# Patient Record
Sex: Male | Born: 1985 | Race: Black or African American | Hispanic: No | Marital: Single | Smoking: Never smoker
Health system: Southern US, Community
[De-identification: ages and names within clinical notes are randomized; demographics above are authoritative.]

## PROBLEM LIST (undated history)

## (undated) DIAGNOSIS — G809 Cerebral palsy, unspecified: Secondary | ICD-10-CM

## (undated) HISTORY — DX: Cerebral palsy, unspecified: G80.9

## (undated) HISTORY — PX: TENDON RELEASE: SHX230

---

## 2005-10-17 ENCOUNTER — Emergency Department: Payer: Self-pay | Admitting: Emergency Medicine

## 2007-04-21 ENCOUNTER — Emergency Department: Payer: Self-pay | Admitting: Emergency Medicine

## 2012-02-25 ENCOUNTER — Emergency Department: Payer: Self-pay | Admitting: Internal Medicine

## 2012-05-06 ENCOUNTER — Emergency Department: Payer: Self-pay | Admitting: Emergency Medicine

## 2012-05-06 LAB — COMPREHENSIVE METABOLIC PANEL
Albumin: 4.5 g/dL (ref 3.4–5.0)
Alkaline Phosphatase: 67 U/L (ref 50–136)
Anion Gap: 7 (ref 7–16)
BUN: 15 mg/dL (ref 7–18)
Bilirubin,Total: 0.9 mg/dL (ref 0.2–1.0)
Calcium, Total: 9.7 mg/dL (ref 8.5–10.1)
Chloride: 106 mmol/L (ref 98–107)
EGFR (African American): >60
EGFR (Non-African Amer.): >60
Glucose: 92 mg/dL (ref 65–99)
Osmolality: 276 (ref 275–301)
SGPT (ALT): 33 U/L (ref 12–78)
Sodium: 138 mmol/L (ref 136–145)

## 2012-05-06 LAB — URINALYSIS, COMPLETE
Bacteria: NONE SEEN
Bilirubin,UR: NEGATIVE
Blood: NEGATIVE
Glucose,UR: NEGATIVE mg/dL (ref 0–75)
Leukocyte Esterase: NEGATIVE
Nitrite: NEGATIVE
Protein: 30
Specific Gravity: 1.03 (ref 1.003–1.030)
WBC UR: 1 /HPF (ref 0–5)

## 2012-05-06 LAB — CBC
HCT: 47.8 % (ref 40.0–52.0)
HGB: 15.1 g/dL (ref 13.0–18.0)
MCHC: 31.5 g/dL — ABNORMAL LOW (ref 32.0–36.0)
MCV: 71 fL — ABNORMAL LOW (ref 80–100)
RBC: 6.7 10*6/uL — ABNORMAL HIGH (ref 4.40–5.90)
RDW: 14.9 % — ABNORMAL HIGH (ref 11.5–14.5)
WBC: 8.6 10*3/uL (ref 3.8–10.6)

## 2012-05-10 ENCOUNTER — Emergency Department: Payer: Self-pay | Admitting: Emergency Medicine

## 2012-05-10 LAB — COMPREHENSIVE METABOLIC PANEL
Anion Gap: 5 — ABNORMAL LOW (ref 7–16)
Chloride: 109 mmol/L — ABNORMAL HIGH (ref 98–107)
Creatinine: 0.88 mg/dL (ref 0.60–1.30)
EGFR (African American): >60
EGFR (Non-African Amer.): >60
Glucose: 80 mg/dL (ref 65–99)
Potassium: 3.7 mmol/L (ref 3.5–5.1)
SGOT(AST): 16 U/L (ref 15–37)
SGPT (ALT): 23 U/L (ref 12–78)
Sodium: 140 mmol/L (ref 136–145)
Total Protein: 7.2 g/dL (ref 6.4–8.2)

## 2012-05-10 LAB — LIPASE, BLOOD: Lipase: 69 U/L — ABNORMAL LOW (ref 73–393)

## 2012-05-10 LAB — CBC
HCT: 42.5 % (ref 40.0–52.0)
MCV: 71 fL — ABNORMAL LOW (ref 80–100)
Platelet: 132 10*3/uL — ABNORMAL LOW (ref 150–440)
RDW: 14.4 % (ref 11.5–14.5)
WBC: 6.1 10*3/uL (ref 3.8–10.6)

## 2014-07-19 IMAGING — CT CT ABD-PELV W/O CM
1 of 2 series · 16 of 32 positions shown, 20 images · non-contrast
Comparison: none

REASON FOR EXAM: (1) right flank pain; (2) right flank pain
COMMENTS:   May transport without cardiac monitor

PROCEDURE:     CT  - CT ABDOMEN AND PELVIS W[DATE] [DATE]
RESULT:     Abdominal CT dated
TECHNIQUE: Helical noncontrasted 3 mm sections were obtained the lung bases
through the pubic symphysis.

[Series 2: 3mm soft tissue · axial · 0.64mm/px · z∈[-554,-158]mm · 16 of 146 slices shown, 20 images]
[im 7/146  soft-tissue]
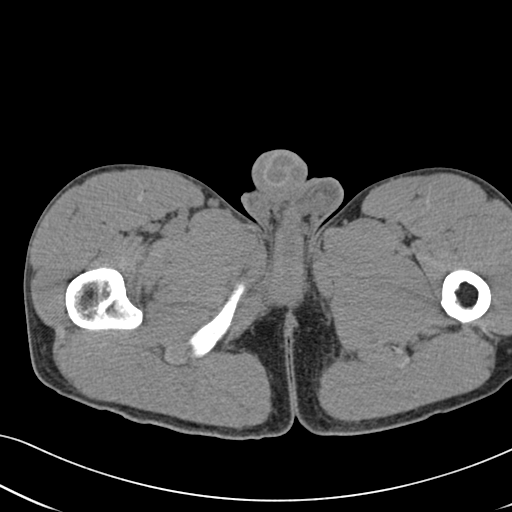
[im 7/146  bone]
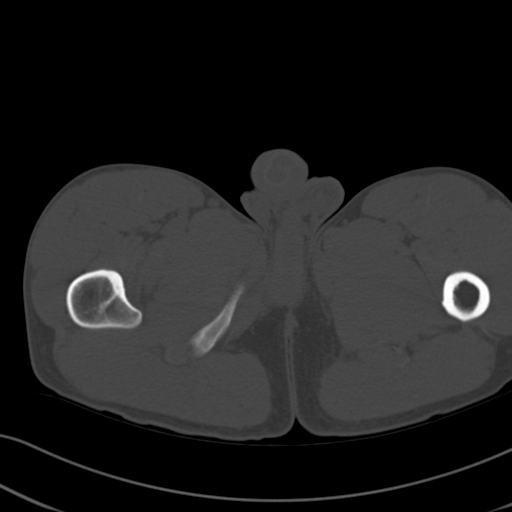
[im 19/146  soft-tissue]
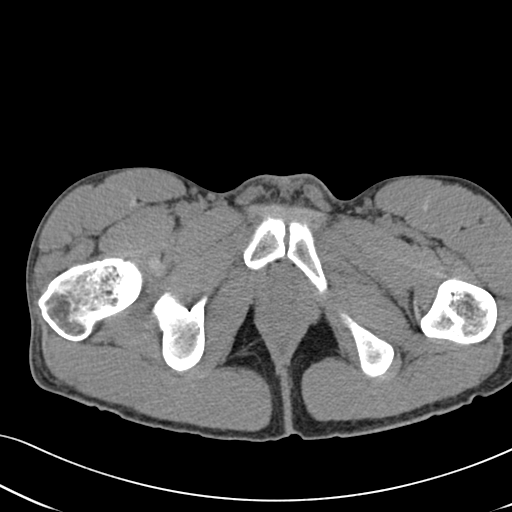
[im 31/146  soft-tissue]
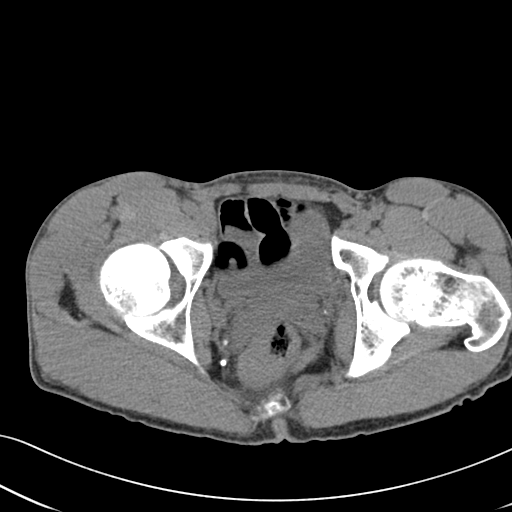
[im 37/146  soft-tissue]
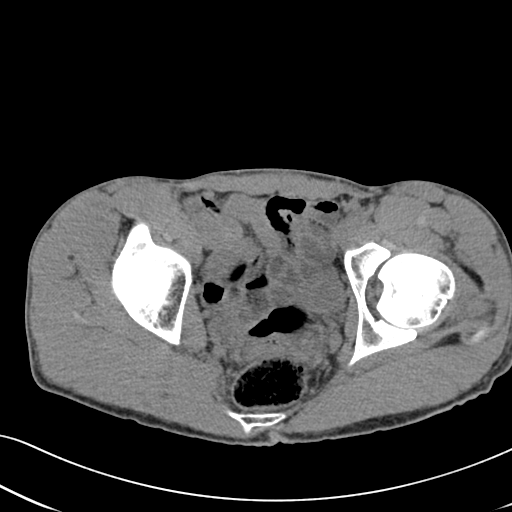
[im 49/146  soft-tissue]
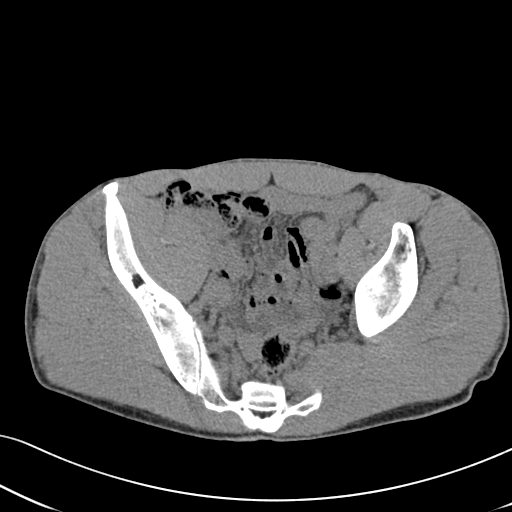
[im 61/146  soft-tissue]
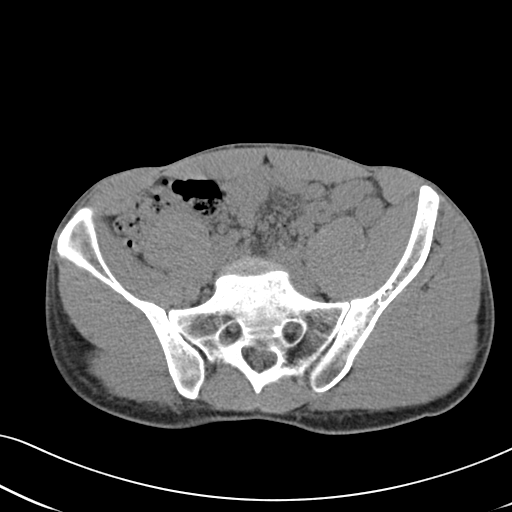
[im 67/146  soft-tissue]
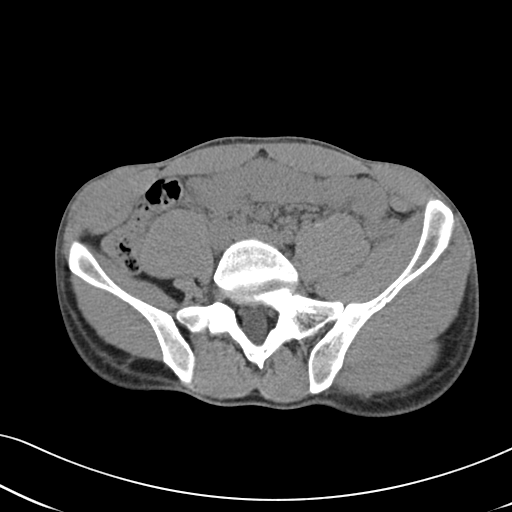
[im 79/146  soft-tissue]
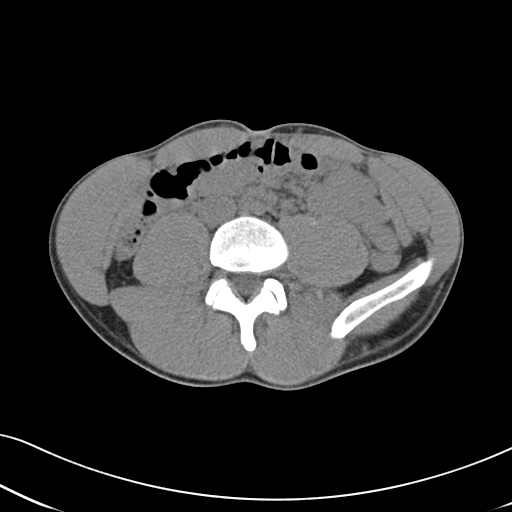
[im 85/146  soft-tissue]
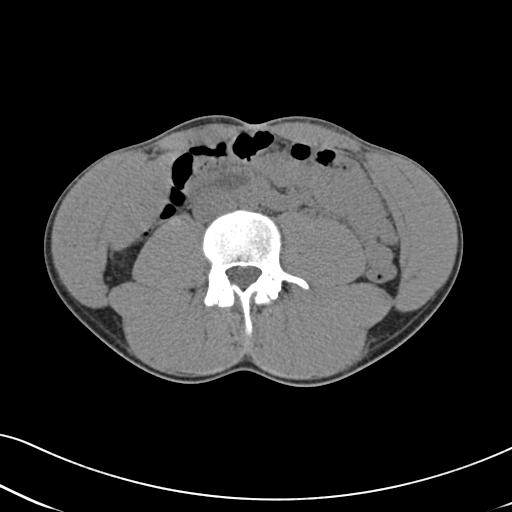
[im 85/146  bone]
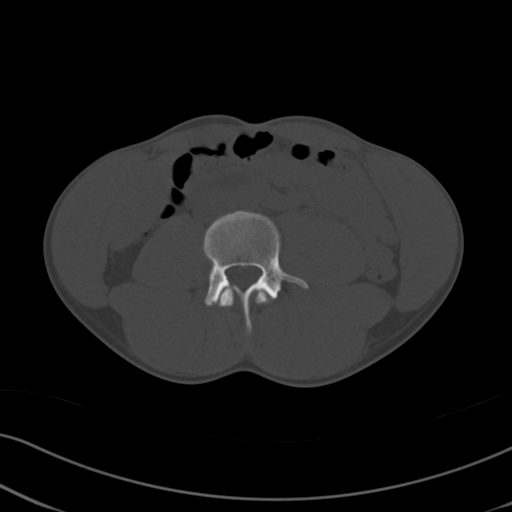
[im 97/146  soft-tissue]
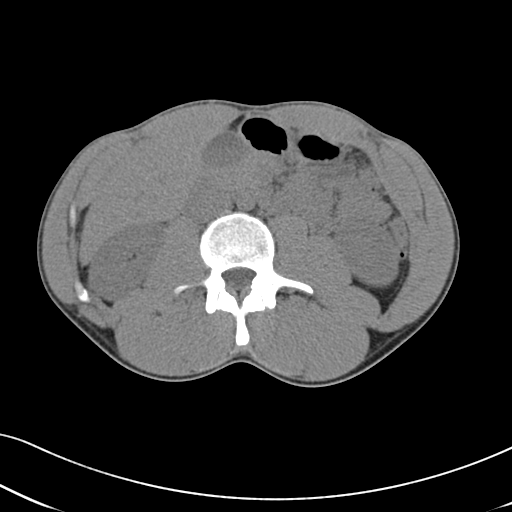
[im 109/146  soft-tissue]
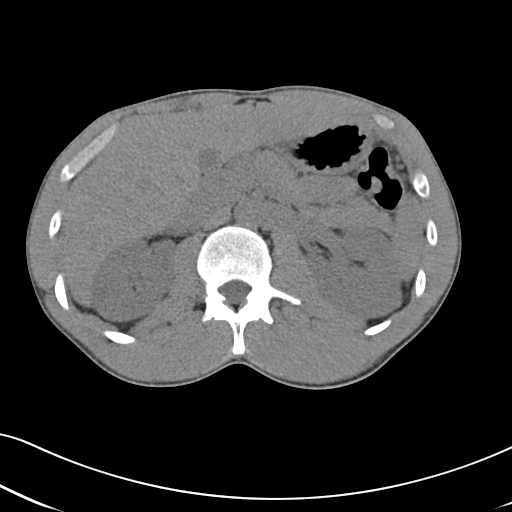
[im 115/146  soft-tissue]
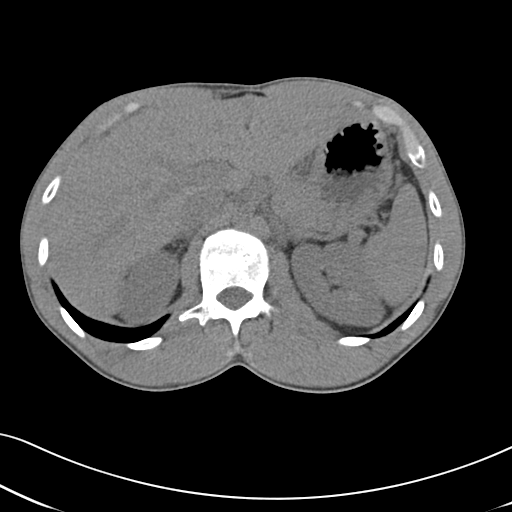
[im 121/146  lung]
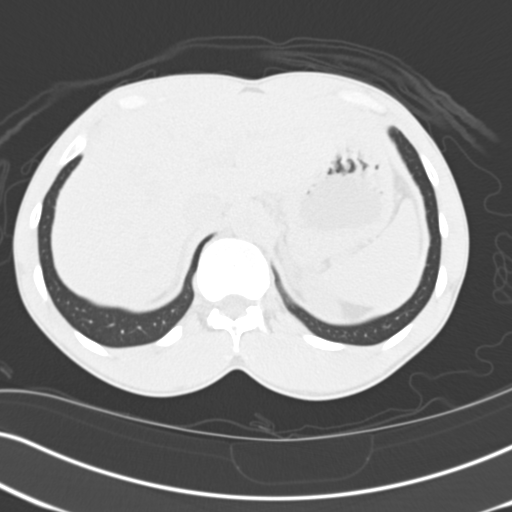
[im 127/146  soft-tissue]
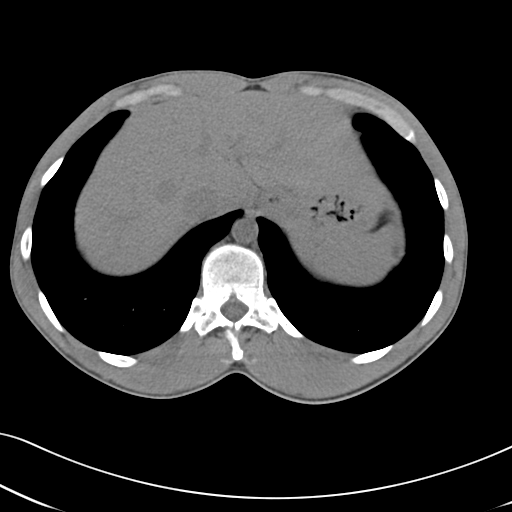
[im 127/146  lung]
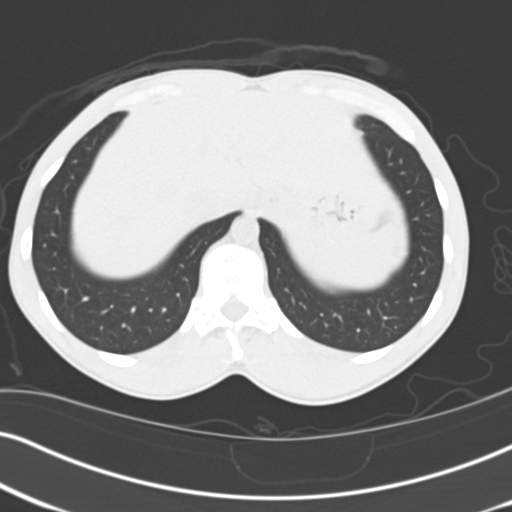
[im 133/146  lung]
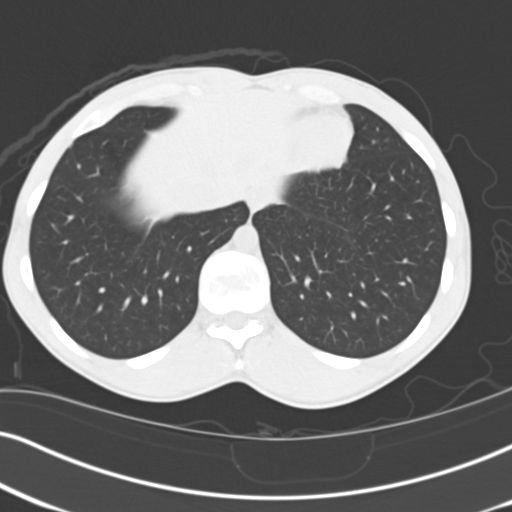
[im 139/146  soft-tissue]
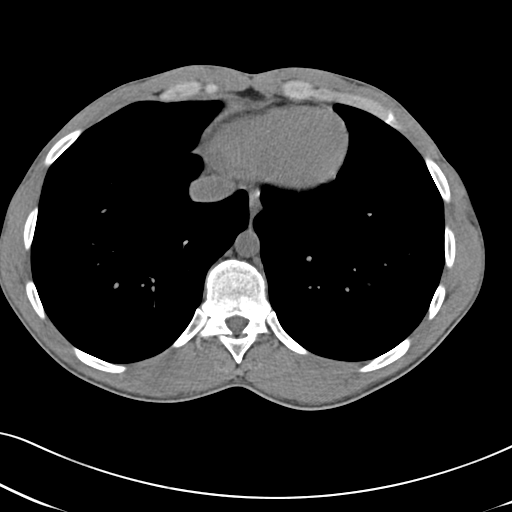
[im 139/146  lung]
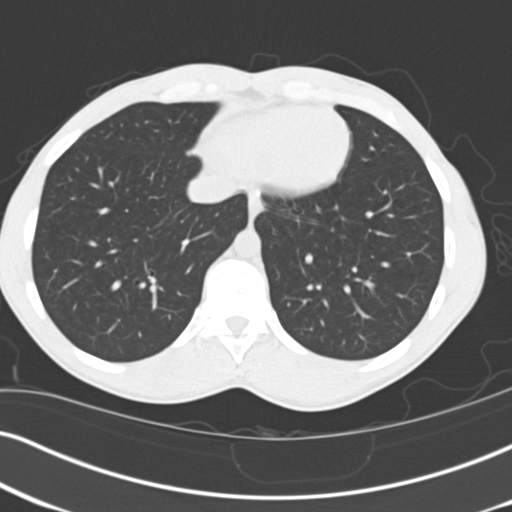

[16 of 32 positions shown; findings below may reference images not displayed]

FINDINGS: The lung bases are unremarkable.

Noncontrast evaluation of the liver, spleen, adrenals, pancreas, kidneys is
unremarkable. There is no CT evidence of bowel obstruction, enteritis,
colitis, diverticulitis, nor appendicitis within the limitations of a
noncontrasted CT. There is no evidence of an abdominal aortic aneurysm.
There is no gross evidence of pathologic sized adenopathy, masses, loculated
fluid collections nor evidence of free fluid within the limitations of a
noncontrasted CT.
IMPRESSION: No CT evidence of obstructive or inflammatory abnormalities.

## 2021-06-20 ENCOUNTER — Ambulatory Visit
Admission: EM | Admit: 2021-06-20 | Discharge: 2021-06-20 | Payer: Self-pay | Attending: Family Medicine | Admitting: Family Medicine

## 2021-06-20 ENCOUNTER — Emergency Department
Admission: EM | Admit: 2021-06-20 | Discharge: 2021-06-20 | Disposition: A | Discharge status: Home / Self Care | Payer: Self-pay | Attending: Emergency Medicine | Admitting: Emergency Medicine

## 2021-06-20 ENCOUNTER — Emergency Department: Payer: Self-pay

## 2021-06-20 ENCOUNTER — Other Ambulatory Visit: Payer: Self-pay

## 2021-06-20 DIAGNOSIS — R61 Generalized hyperhidrosis: Secondary | ICD-10-CM | POA: Insufficient documentation

## 2021-06-20 DIAGNOSIS — R1112 Projectile vomiting: Secondary | ICD-10-CM | POA: Insufficient documentation

## 2021-06-20 DIAGNOSIS — K30 Functional dyspepsia: Secondary | ICD-10-CM | POA: Insufficient documentation

## 2021-06-20 DIAGNOSIS — R112 Nausea with vomiting, unspecified: Secondary | ICD-10-CM

## 2021-06-20 DIAGNOSIS — R519 Headache, unspecified: Secondary | ICD-10-CM | POA: Insufficient documentation

## 2021-06-20 DIAGNOSIS — D72829 Elevated white blood cell count, unspecified: Secondary | ICD-10-CM | POA: Insufficient documentation

## 2021-06-20 DIAGNOSIS — R202 Paresthesia of skin: Secondary | ICD-10-CM | POA: Insufficient documentation

## 2021-06-20 DIAGNOSIS — R079 Chest pain, unspecified: Secondary | ICD-10-CM

## 2021-06-20 LAB — BASIC METABOLIC PANEL
Anion gap: 11 (ref 5–15)
BUN: 14 mg/dL (ref 6–20)
CO2: 21 mmol/L — ABNORMAL LOW (ref 22–32)
Calcium: 9.9 mg/dL (ref 8.9–10.3)
Chloride: 104 mmol/L (ref 98–111)
Creatinine, Ser: 0.91 mg/dL (ref 0.61–1.24)
GFR, Estimated: >60 mL/min (ref >60)
Glucose, Bld: 119 mg/dL — ABNORMAL HIGH (ref 70–99)
Potassium: 3.5 mmol/L (ref 3.5–5.1)
Sodium: 136 mmol/L (ref 135–145)

## 2021-06-20 LAB — HEPATIC FUNCTION PANEL
ALT: 21 U/L (ref 0–44)
AST: 28 U/L (ref 15–41)
Albumin: 4.4 g/dL (ref 3.5–5.0)
Alkaline Phosphatase: 63 U/L (ref 38–126)
Bilirubin, Direct: 0.2 mg/dL (ref 0.0–0.2)
Indirect Bilirubin: 1.2 mg/dL — ABNORMAL HIGH (ref 0.3–0.9)
Total Bilirubin: 1.4 mg/dL — ABNORMAL HIGH (ref 0.3–1.2)
Total Protein: 7.7 g/dL (ref 6.5–8.1)

## 2021-06-20 LAB — CBC WITH DIFFERENTIAL/PLATELET
Abs Immature Granulocytes: 0.06 10*3/uL (ref 0.00–0.07)
Basophils Absolute: 0 10*3/uL (ref 0.0–0.1)
Basophils Relative: 0 %
Eosinophils Absolute: 0 10*3/uL (ref 0.0–0.5)
Eosinophils Relative: 0 %
HCT: 45.9 % (ref 39.0–52.0)
Hemoglobin: 14.2 g/dL (ref 13.0–17.0)
Immature Granulocytes: 1 %
Lymphocytes Relative: 8 %
Lymphs Abs: 0.9 10*3/uL (ref 0.7–4.0)
MCH: 21.6 pg — ABNORMAL LOW (ref 26.0–34.0)
MCHC: 30.9 g/dL (ref 30.0–36.0)
MCV: 70 fL — ABNORMAL LOW (ref 80.0–100.0)
Monocytes Absolute: 0.5 10*3/uL (ref 0.1–1.0)
Monocytes Relative: 4 %
Neutro Abs: 10.2 10*3/uL — ABNORMAL HIGH (ref 1.7–7.7)
Neutrophils Relative %: 87 %
Platelets: 177 10*3/uL (ref 150–400)
RBC: 6.56 MIL/uL — ABNORMAL HIGH (ref 4.22–5.81)
RDW: 15.4 % (ref 11.5–15.5)
WBC: 11.7 10*3/uL — ABNORMAL HIGH (ref 4.0–10.5)
nRBC: 0 % (ref 0.0–0.2)

## 2021-06-20 LAB — POCT FASTING CBG KUC MANUAL ENTRY: POCT Glucose (KUC): 140 mg/dL — AB (ref 70–99)

## 2021-06-20 LAB — TROPONIN I (HIGH SENSITIVITY)
Troponin I (High Sensitivity): 4 ng/L (ref <18)
Troponin I (High Sensitivity): 3 ng/L (ref <18)

## 2021-06-20 LAB — LIPASE, BLOOD: Lipase: 23 U/L (ref 11–51)

## 2021-06-20 MED ORDER — ONDANSETRON 4 MG PO TBDP: 4.0000 mg | ORAL_TABLET | Freq: Three times a day (TID) | ORAL | 0 refills | Status: AC | PRN

## 2021-06-20 MED ORDER — ONDANSETRON 4 MG PO TBDP
4.0000 mg | ORAL_TABLET | Freq: Once | ORAL | Status: AC
  Administered 2021-06-20: 4 mg via ORAL

## 2021-06-20 NOTE — ED Triage Notes (Signed)
Patient presents to Urgent Care with complaints of emesis, chest pain, SOB, diaphoresis, and generalized numbness since today. He states no hx of DM or cardiac problems. He reports marijuana use from his own stash. Brought in by his gf.  ?

## 2021-06-20 NOTE — ED Notes (Signed)
EMS called

## 2021-06-20 NOTE — ED Provider Notes (Signed)
? ?[facility] ?Provider Note ? ? ? Event Date/Time  ? First MD Initiated Contact with Patient 06/20/21 2210   ?  (approximate) ? ? ?History  ? ?Chest Pain and Emesis ? ? ?HPI ? ?Glenn Mccormick is a 36 y.o. male with a past medical history of cerebral palsy who presents to the emergency room after an referred from urgent care for further evaluation of some chest pain associated with headache as well as nausea and vomiting and some tingling in his hands that he states started today.  States it started after he smoked some weed eating up from a cousin is not sure if there is something in that.  He had an Egg McMuffin for breakfast is not sure if he could have gotten food poisoning.  States Zofran he received earlier has helped a lot and he is feeling a lot better.  He longer has any significant chest pain.  He denies any associated fevers, cough, shortness of breath abdominal pain, back pain, earache, sore throat, vision changes or any focal weakness.  No other illicit drugs or any EtOH use.  He denies tobacco abuse.  No recent falls or injuries.  No other acute concerns at this time. ? ?Patient did receive patient 324 mg of ASA prior to arrival. ? ?  ?History reviewed. No pertinent past medical history. ? ? ? ?Physical Exam  ?Triage Vital Signs: ?ED Triage Vitals  ?Enc Vitals Group  ?   BP 06/20/21 1919 96/81  ?   Pulse Rate 06/20/21 1919 81  ?   Resp 06/20/21 1919 (!) 22  ?   Temp 06/20/21 1923 97.9 ?F (36.6 ?C)  ?   Temp Source 06/20/21 1923 Oral  ?   SpO2 06/20/21 1919 98 %  ?   Weight 06/20/21 1920 145 lb (65.8 kg)  ?   Height 06/20/21 1920 5\' 8"  (1.727 m)  ?   Head Circumference --   ?   Peak Flow --   ?   Pain Score 06/20/21 1919 6  ?   Pain Loc --   ?   Pain Edu? --   ?   Excl. in GC? --   ? ? ?Most recent vital signs: ?Vitals:  ? 06/20/21 2152 06/20/21 2252  ?BP: 121/67 118/72  ?Pulse: 69 78  ?Resp: 20 18  ?Temp:  98 ?F (36.7 ?C)  ?SpO2: 99% 99%  ? ? ?General: Awake, no distress.   ?CV:  Good peripheral perfusion.  2+ radial pulses.  No murmur. ?Resp:  Normal effort.  Clear bilaterally. ?Abd:  No distention.  Soft throughout ?Other:   ? ? ?ED Results / Procedures / Treatments  ?Labs ?(all labs ordered are listed, but only abnormal results are displayed) ?Labs Reviewed  ?BASIC METABOLIC PANEL - Abnormal; Notable for the following components:  ?    Result Value  ? CO2 21 (*)   ? Glucose, Bld 119 (*)   ? All other components within normal limits  ?CBC WITH DIFFERENTIAL/PLATELET - Abnormal; Notable for the following components:  ? WBC 11.7 (*)   ? RBC 6.56 (*)   ? MCV 70.0 (*)   ? MCH 21.6 (*)   ? Neutro Abs 10.2 (*)   ? All other components within normal limits  ?HEPATIC FUNCTION PANEL - Abnormal; Notable for the following components:  ? Total Bilirubin 1.4 (*)   ? Indirect Bilirubin 1.2 (*)   ? All other components within normal limits  ?LIPASE, BLOOD  ?  TROPONIN I (HIGH SENSITIVITY)  ?TROPONIN I (HIGH SENSITIVITY)  ? ? ? ?EKG ? ?EKG is remarkable for sinus rhythm with a ventricular rate of 77, normal axis, nonspecific ST change in anterior and lateral leads without other clear evidence of acute ischemia or significant arrhythmia. ? ? ?RADIOLOGY ?Chest reviewed by myself shows no focal consoidation, effusion, edema, pneumothorax or other clear acute thoracic process. I also reviewed radiology interpretation and agree with findings described. ? ? ? ?PROCEDURES: ? ?Critical Care performed: No ? ?.1-3 Lead EKG Interpretation ?Performed by: Gilles Chiquito, MD ?Authorized by: Gilles Chiquito, MD  ? ?  Interpretation: normal   ?  ECG rate assessment: normal   ?  Rhythm: sinus rhythm   ?  Ectopy: none   ?  Conduction: normal   ? ?The patient is on the cardiac monitor to evaluate for evidence of arrhythmia and/or significant heart rate changes. ? ? ?MEDICATIONS ORDERED IN ED: ?Medications - No data to display ? ? ?IMPRESSION / MDM / ASSESSMENT AND PLAN / ED COURSE  ?I reviewed the triage vital signs  and the nursing notes. ?             ?               ? ?Differential diagnosis includes, but is not limited to ACS, pneumonia, pneumothorax, metabolic derangements, gastritis, and possible arrhythmia.  While patient does not have any ongoing chest pain or abnormal vitals or any shortness of breath it is just a PE.  No clear risk factors for this.  Overall description of symptoms is not suggestive of an acute dissection. ? ?ECG with nonspecific findings however given nonelevated troponin x2 Evalose patient for ACS or myocarditis.  Nonelevated troponin x2 are not suggestive of ACS or myocarditis. ? ?Chest reviewed by myself shows no focal consoidation, effusion, edema, pneumothorax or other clear acute thoracic process. I also reviewed radiology interpretation and agree with findings described. ? ?BMP shows no significant electrolyte or metabolic derangements.  CBC shows WBC count of 11.7 and no evidence of acute anemia.  Hepatic function panel grossly unremarkable and lipase not suggestive of pancreatitis.  Unclear if episode earlier today was related to some gastritis or effects of cannabis use.  Additional considerations include infectious gastritis.  Given stable vitals with patient stating he is feeling much better and low suspicion for other immediate life-threatening process I think he is stable for discharge with outpatient follow-up.  Counseled on avoiding any similar THC products.  Discharged in stable condition.  Strict and precautions advised discussed.  Instructed follow-up with primary care. ? ?  ? ? ?FINAL CLINICAL IMPRESSION(S) / ED DIAGNOSES  ? ?Final diagnoses:  ?Chest pain, unspecified type  ?Nausea and vomiting, unspecified vomiting type  ? ? ? ?Rx / DC Orders  ? ?ED Discharge Orders   ? ?      Ordered  ?  ondansetron (ZOFRAN-ODT) 4 MG disintegrating tablet  Every 8 hours PRN       ? 06/20/21 2250  ? ?  ?  ? ?  ? ? ? ?Note:  This document was prepared using Dragon voice recognition software and may  include unintentional dictation errors. ?  ?Gilles Chiquito, MD ?06/20/21 2333 ? ?

## 2021-06-20 NOTE — ED Notes (Signed)
Provider at bedside

## 2021-06-20 NOTE — ED Triage Notes (Signed)
Per EMS, pt was brought from urgent care with c/o CP 10+ episodes of emesis, nausea, "numbness" and pain in chest and arms bilaterally, diaphoresis, and pallor that started this AM. Pt was given SL zofran at clinic and 4 81 mg ASA en route. Pt admits to smoking marijuana this AM- states this is not unusual and nothing has changed with supply. Pt is AOX4, appears uncomfortable. Skin is warm and dry, CMS intact. Reports exposure to virus.  ?BGL-140 ?137/72 ?72 ?22 ?History of cerebral palsy, no cardiac history per pt ?

## 2021-06-20 NOTE — ED Provider Notes (Signed)
?[facility] ? ? ? ?CSN: [id number] ?Arrival date & time: 06/20/21  1800 ? ? ?  ? ?History   ?Chief Complaint ?No chief complaint on file. ? ? ?HPI ?Glenn Mccormick is a 36 y.o. male.  ? ?HPI ?Patient presented and was sitting in the lobby diaphoretic, complaining of nausea and bilateral numb's in both arms and further explained that he was having chest pain and headache. Patient immediately began to projectile vomit, became lethargic, although remained conscious, and oriented. Patient was placed in a wheelchair , brought to an exam room, blood sugar collect-140, ECG NSR with right Axis deviation-no acute ST elevated. Patient given Zofran for nausea. 911 called arrived less than 15 minutes.  ? ?No past medical history on file. ? ?There are no problems to display for this patient. ? ? ? ? ?Home Medications   ? ?Prior to Admission medications   ?Not on File  ? ? ?Family History ?No family history on file. ? ?Social History ?  ? ? ?Allergies   ?Patient has no allergy information on record. ? ? ?Review of Systems ?Review of Systems ?Pertinent negatives listed in HPI  ? ?Physical Exam ?Triage Vital Signs ?ED Triage Vitals  ?Enc Vitals Group  ?   BP   ?   Pulse   ?   Resp   ?   Temp   ?   Temp src   ?   SpO2   ?   Weight   ?   Height   ?   Head Circumference   ?   Peak Flow   ?   Pain Score   ?   Pain Loc   ?   Pain Edu?   ?   Excl. in [place]?   ? ?No data found. ? ?Updated Vital Signs ?BP (!) 145/71 (BP Location: Left Arm)   Pulse 78   Temp 98.1 ?F (36.7 ?C) (Oral)   Resp (!) 22   SpO2 98%  ? ?Visual Acuity ?Right Eye Distance:   ?Left Eye Distance:   ?Bilateral Distance:   ? ?Right Eye Near:   ?Left Eye Near:    ?Bilateral Near:    ? ?Physical Exam ?Constitutional:   ?   General: He is in acute distress.  ?   Appearance: He is ill-appearing and diaphoretic.  ?Cardiovascular:  ?   Rate and Rhythm: Normal rate and regular rhythm.  ?Pulmonary:  ?   Effort: Tachypnea, accessory muscle usage and respiratory distress  present.  ?Skin: ?   General: Skin is warm and moist.  ?Neurological:  ?   GCS: GCS eye subscore is 4. GCS verbal subscore is 5. GCS motor subscore is 6.  ?Psychiatric:     ?   Attention and Perception: Attention normal.     ?   Mood and Affect: Mood normal.  ? ? ?UC Treatments / Results  ?Labs ?(all labs ordered are listed, but only abnormal results are displayed) ?Labs Reviewed - No data to display ? ?EKG ? ? ?Radiology ?No results found. ? ?Procedures ?Procedures (including critical care time) ? ?Medications Ordered in UC ?Medications - No data to display ? ?Initial Impression / Assessment and Plan / UC Course  ?I have reviewed the triage vital signs and the nursing notes. ? ?Pertinent labs & imaging results that were available during my care of the patient were reviewed by me and considered in my medical decision making (see chart for details). ? ?  ?EMS arrived and  assumed care.  Patient provided [place] with numbers to contact his family.  We have called all numbers and been unsuccessful in reaching anyone by phone.  He was dropped off here by his girlfriend.  We will notify her if she returns prior to closing that he was transported to [facility].  Patient is alert and stable when discharged to EMS.Marland Kitchen ?Final Clinical Impressions(s) / UC Diagnoses  ? ?Final diagnoses:  ?Gastrointestinal distress  ?Diaphoresis  ?Chest pain, unspecified type  ?Projectile vomiting with nausea  ?Paresthesia  ? ?Discharge Instructions   ?None ?  ? ?ED Prescriptions   ?None ?  ? ?PDMP not reviewed this encounter. ?  ?Scot Jun, FNP ?06/20/21 1858 ? ?

## 2021-06-20 NOTE — ED Triage Notes (Addendum)
Enter in error

## 2022-01-26 ENCOUNTER — Ambulatory Visit: Admission: EM | Admit: 2022-01-26 | Discharge: 2022-01-26 | Discharge status: Against Medical Advice | Payer: Self-pay

## 2022-01-26 ENCOUNTER — Emergency Department
Admission: EM | Admit: 2022-01-26 | Discharge: 2022-01-26 | Disposition: A | Discharge status: Home / Self Care | Payer: Self-pay | Attending: Emergency Medicine | Admitting: Emergency Medicine

## 2022-01-26 ENCOUNTER — Encounter: Payer: Self-pay | Admitting: Emergency Medicine

## 2022-01-26 ENCOUNTER — Other Ambulatory Visit: Payer: Self-pay

## 2022-01-26 DIAGNOSIS — M436 Torticollis: Secondary | ICD-10-CM | POA: Insufficient documentation

## 2022-01-26 MED ORDER — KETOROLAC TROMETHAMINE 15 MG/ML IJ SOLN
15.0000 mg | Freq: Once | INTRAMUSCULAR | Status: AC
  Administered 2022-01-26: 15 mg via INTRAMUSCULAR
  Filled 2022-01-26: qty 1

## 2022-01-26 MED ORDER — LIDOCAINE 5 % EX PTCH: 1.0000 | MEDICATED_PATCH | Freq: Two times a day (BID) | CUTANEOUS | 0 refills | Status: AC

## 2022-01-26 MED ORDER — LIDOCAINE 5 % EX PTCH
1.0000 | MEDICATED_PATCH | CUTANEOUS | Status: DC
  Administered 2022-01-26: 1 via TRANSDERMAL
  Filled 2022-01-26: qty 1

## 2022-01-26 MED ORDER — CYCLOBENZAPRINE HCL 10 MG PO TABS
10.0000 mg | ORAL_TABLET | Freq: Once | ORAL | Status: AC
  Administered 2022-01-26: 10 mg via ORAL
  Filled 2022-01-26: qty 1

## 2022-01-26 MED ORDER — CYCLOBENZAPRINE HCL 5 MG PO TABS: 5.0000 mg | ORAL_TABLET | Freq: Three times a day (TID) | ORAL | 0 refills | Status: AC | PRN

## 2022-01-26 MED ORDER — NAPROXEN 500 MG PO TABS
500.0000 mg | ORAL_TABLET | Freq: Two times a day (BID) | ORAL | 0 refills | Status: AC
Start: 2022-01-26 — End: 2022-02-02

## 2022-01-26 NOTE — ED Triage Notes (Signed)
Pt presents to ED via POV due to neck spasms that started this morning.Pt A&Ox4. Airway patent.

## 2022-01-26 NOTE — ED Provider Notes (Signed)
[facility] Provider Note    Event Date/Time   First MD Initiated Contact with Patient 01/26/22 [phone]     (approximate)   History   Neck Pain   HPI  Glenn Mccormick is a 36 y.o. male who presents today for evaluation of bilateral posterior neck pain and decreased range of motion.  He reports that he slept in a different position last night and woke up feeling like he had a stiff neck.  He has pain to his bilateral sides, though worse on the left.  He denies headache, dizziness, visual changes, or paresthesias/weakness in his upper extremities.  There are no problems to display for this patient.         Physical Exam   Triage Vital Signs: ED Triage Vitals  Enc Vitals Group     BP 01/26/22 0926 139/79     Pulse Rate 01/26/22 0926 67     Resp 01/26/22 0926 16     Temp 01/26/22 0926 98.4 F (36.9 C)     Temp Source 01/26/22 0926 Oral     SpO2 01/26/22 0926 99 %     Weight 01/26/22 0928 147 lb (66.7 kg)     Height 01/26/22 0928 5\' 8"  (1.727 m)     Head Circumference --      Peak Flow --      Pain Score 01/26/22 0927 7     Pain Loc --      Pain Edu? --      Excl. in GC? --     Most recent vital signs: Vitals:   01/26/22 0926  BP: 139/79  Pulse: 67  Resp: 16  Temp: 98.4 F (36.9 C)  SpO2: 99%    Physical Exam Vitals and nursing note reviewed.  Constitutional:      General: Awake and alert. No acute distress.    Appearance: Normal appearance. The patient is normal weight.  HENT:     Head: Normocephalic and atraumatic.     Mouth: Mucous membranes are moist.  Eyes:     General: PERRL. Normal EOMs        Right eye: No discharge.        Left eye: No discharge.     Conjunctiva/sclera: Conjunctivae normal.  Cardiovascular:     Rate and Rhythm: Normal rate and regular rhythm.     Pulses: Normal pulses.     Heart sounds: Normal heart sounds Pulmonary:     Effort: Pulmonary effort is normal. No respiratory distress.     Breath sounds:  Normal breath sounds.  Abdominal:     Abdomen is soft. There is no abdominal tenderness. No rebound or guarding. No distention. Musculoskeletal:        General: No swelling. Normal range of motion.     Cervical back: Normal range of motion and neck supple. No midline cervical spine tenderness. Palpably tight muscles to bilateral sternocleidomastoid. Mild decreased ROM with rotation. Negative Spurling test.  Negative Lhermitte sign.  Normal strength and sensation in bilateral upper extremities. Normal grip strength bilaterally.  Normal intrinsic muscle function of the hand bilaterally.  Normal radial pulses bilaterally. Skin:    General: Skin is warm and dry.     Capillary Refill: Capillary refill takes less than 2 seconds.     Findings: No rash.  Neurological:     Mental Status: The patient is awake and alert.      ED Results / Procedures / Treatments   Labs (all labs  ordered are listed, but only abnormal results are displayed) Labs Reviewed - No data to display   EKG     RADIOLOGY     PROCEDURES:  Critical Care performed:   Procedures   MEDICATIONS ORDERED IN ED: Medications  lidocaine (LIDODERM) 5 % 1 patch (1 patch Transdermal Patch Applied 01/26/22 1026)  ketorolac (TORADOL) 15 MG/ML injection 15 mg (15 mg Intramuscular Given 01/26/22 1027)  cyclobenzaprine (FLEXERIL) tablet 10 mg (10 mg Oral Given 01/26/22 1029)     IMPRESSION / MDM / ASSESSMENT AND PLAN / ED COURSE  I reviewed the triage vital signs and the nursing notes.   Differential diagnosis includes, but is not limited to, muscle spasm, muscle strain, torticollis.  No headache, visual changes, neurological deficits, trauma, or unilateral pain to suggest vascular injury such as carotid or or vertebral artery dissection.  No midline cervical spine tenderness to suggest cervical spine injury or fracture.  Negative Spurling test, negative Lhermitte sign, no radicular symptoms, normal strength and sensation  bilateral upper extremities, do not suspect central cord syndrome or radiculopathy.  Symptoms are most consistent with torticollis at this time, and he has palpable muscle spasms along his sternocleidomastoid muscles.  He was given muscle relaxant, Toradol, and a Lidoderm patch.  He was given prescription for Flexeril, though advised that this can make him sleepy or impair his judgment, and he was advised that he cannot drive, operate heavy machinery, or perform any tests or require concentration while taking this medication.  We also discussed strict return precautions and the importance of close outpatient follow-up.  Patient or stands and agrees with plan.  He was discharged in stable condition.   Patient's presentation is most consistent with acute illness / injury with system symptoms.    FINAL CLINICAL IMPRESSION(S) / ED DIAGNOSES   Final diagnoses:  Torticollis, acute     Rx / DC Orders   ED Discharge Orders          Ordered    cyclobenzaprine (FLEXERIL) 5 MG tablet  3 times daily PRN        01/26/22 1053    naproxen (NAPROSYN) 500 MG tablet  2 times daily with meals        01/26/22 1053    lidocaine (LIDODERM) 5 %  Every 12 hours        01/26/22 1053             Note:  This document was prepared using Dragon voice recognition software and may include unintentional dictation errors.   Keturah Shavers 01/26/22 1346    Loleta Rose, MD 01/26/22 1601

## 2022-01-26 NOTE — Discharge Instructions (Addendum)
You may take the medications as prescribed.  Remember that the cyclobenzaprine (Flexeril ) can make you sleepy or impair your judgment, so please do not drive, operate heavy machinery, or perform any tasks that require concentration while taking this medication.  Please return to the emergency department for any new, worsening, or changing symptoms or other concerns including weakness in your arms/legs, urinary or stool incontinence or retention, numbness or tingling in your extremities/buttocks/groin, fevers, or any other concerns or change in symptoms.

## 2022-09-04 ENCOUNTER — Ambulatory Visit
Admission: EM | Admit: 2022-09-04 | Discharge: 2022-09-04 | Disposition: A | Discharge status: Home / Self Care | Payer: Self-pay | Attending: Emergency Medicine | Admitting: Emergency Medicine

## 2022-09-04 DIAGNOSIS — R112 Nausea with vomiting, unspecified: Secondary | ICD-10-CM

## 2022-09-04 MED ORDER — ONDANSETRON 4 MG PO TBDP: 4.0000 mg | ORAL_TABLET | Freq: Three times a day (TID) | ORAL | 0 refills | Status: AC | PRN

## 2022-09-04 NOTE — ED Triage Notes (Addendum)
Patient to Urgent Care with complaints of nausea and emesis that started this morning. Reports stomach cramps. No diarrhea. Denies any fevers.    Symptoms started this morning.

## 2022-09-04 NOTE — ED Provider Notes (Signed)
Glenn Mccormick    CSN: [id number] Arrival date & time: 09/04/22  1917      History   Chief Complaint Chief Complaint  Patient presents with   Emesis    HPI Glenn Mccormick is a 37 y.o. male.  Patient presents with nausea and vomiting and abdominal cramping since this morning.  Last emesis at 10 am.  He has been able to drink water and stay hydrated.  No fever, chills, diarrhea, constipation, or other symptoms.  His medical history includes cerebral palsy.     The history is provided by the patient and medical records.  The history is provided by the patient and medical records.  The history is provided by the patient and medical records.  The history is provided by the patient.  Emesis  Emesis  Emesis  Emesis   Past Medical History:  Diagnosis Date   Cerebral palsy (HCC)     There are no problems to display for this patient.   Past Surgical History:  Procedure Laterality Date   TENDON RELEASE Bilateral    legs for CP       Home Medications    Prior to Admission medications   Medication Sig Start Date End Date Taking? Authorizing Provider  ondansetron (ZOFRAN-ODT) 4 MG disintegrating tablet Take 1 tablet (4 mg total) by mouth every 8 (eight) hours as needed for nausea or vomiting. 09/04/22  Yes Mickie Bail, NP  cyclobenzaprine (FLEXERIL) 5 MG tablet Take 1 tablet (5 mg total) by mouth 3 (three) times daily as needed for muscle spasms. 01/26/22   Poggi, Herb Grays, PA-C    Family History History reviewed. No pertinent family history.  Social History Social History   Tobacco Use   Smoking status: Never   Smokeless tobacco: Never  Substance Use Topics   Drug use: Yes    Frequency: 7.0 times per week    Types: Marijuana     Allergies   Patient has no known allergies.   Review of Systems Review of Systems  Constitutional:  Negative for chills and fever.  Respiratory:  Negative for cough and shortness of breath.   Cardiovascular:  Negative  for chest pain and palpitations.  Gastrointestinal:  Positive for abdominal pain, nausea and vomiting. Negative for constipation and diarrhea.     Physical Exam Triage Vital Signs ED Triage Vitals  Enc Vitals Group     BP      Pulse      Resp      Temp      Temp src      SpO2      Weight      Height      Head Circumference      Peak Flow      Pain Score      Pain Loc      Pain Edu?      Excl. in [place]?    No data found.  Updated Vital Signs BP 126/78   Pulse 71   Temp 97.7 F (36.5 C)   Resp 18   SpO2 98%   Visual Acuity Right Eye Distance:   Left Eye Distance:   Bilateral Distance:    Right Eye Near:   Left Eye Near:    Bilateral Near:     Physical Exam Vitals and nursing note reviewed.  Constitutional:      General: He is not in acute distress.    Appearance: He is well-developed.  HENT:  Mouth/Throat:     Mouth: Mucous membranes are moist.  Cardiovascular:     Rate and Rhythm: Normal rate and regular rhythm.     Heart sounds: Normal heart sounds.  Pulmonary:     Effort: Pulmonary effort is normal. No respiratory distress.     Breath sounds: Normal breath sounds.  Abdominal:     General: Bowel sounds are normal.     Palpations: Abdomen is soft.     Tenderness: There is no abdominal tenderness. There is no guarding or rebound.  Musculoskeletal:     Cervical back: Neck supple.  Skin:    General: Skin is warm and dry.  Neurological:     Mental Status: He is alert.  Psychiatric:        Mood and Affect: Mood normal.        Behavior: Behavior normal.      UC Treatments / Results  Labs (all labs ordered are listed, but only abnormal results are displayed) Labs Reviewed - No data to display  EKG   Radiology No results found.  Procedures Procedures (including critical care time)  Medications Ordered in UC Medications - No data to display  Initial Impression / Assessment and Plan / UC Course  I have reviewed the triage vital signs  and the nursing notes.  Pertinent labs & imaging results that were available during my care of the patient were reviewed by me and considered in my medical decision making (see chart for details).    Nausea and vomiting.  Afebrile and vital signs are stable.  Patient appears well-hydrated.  His abdomen is soft and nontender with good bowel sounds.  Treating nausea and vomiting with Zofran.  Discussed clear liquid diet. ED precautions discussed.  Education provided on nausea and vomiting.  Instructed patient to follow-up with his PCP as needed.  Work note provided per patient request.  He agrees to plan of care.   Final Clinical Impressions(s) / UC Diagnoses   Final diagnoses:  Nausea and vomiting, unspecified vomiting type     Discharge Instructions      Take the antinausea medication as directed.    Keep yourself hydrated with clear liquids, such as water and Gatorade.     Go to the emergency department if you have worsening symptoms.    Follow up with your primary care provider.        ED Prescriptions     Medication Sig Dispense Auth. Provider   ondansetron (ZOFRAN-ODT) 4 MG disintegrating tablet Take 1 tablet (4 mg total) by mouth every 8 (eight) hours as needed for nausea or vomiting. 20 tablet Mickie Bail, NP      PDMP not reviewed this encounter.   Mickie Bail, NP 09/04/22 1940

## 2022-09-04 NOTE — Discharge Instructions (Signed)
Take the antinausea medication as directed.    Keep yourself hydrated with clear liquids, such as water and Gatorade.     Go to the emergency department if you have worsening symptoms.    Follow up with your primary care provider.      

## 2023-08-29 IMAGING — CR DG CHEST 2V
1 series · 2 of 2 positions shown · non-contrast
Comparison: None.

CLINICAL DATA: Chest pain, emesis, nausea

EXAM:
CHEST - 2 VIEW

[Series 1: dg chest 2 view · 0.14mm/px · 2 of 2 slices shown]
[im 1/2]
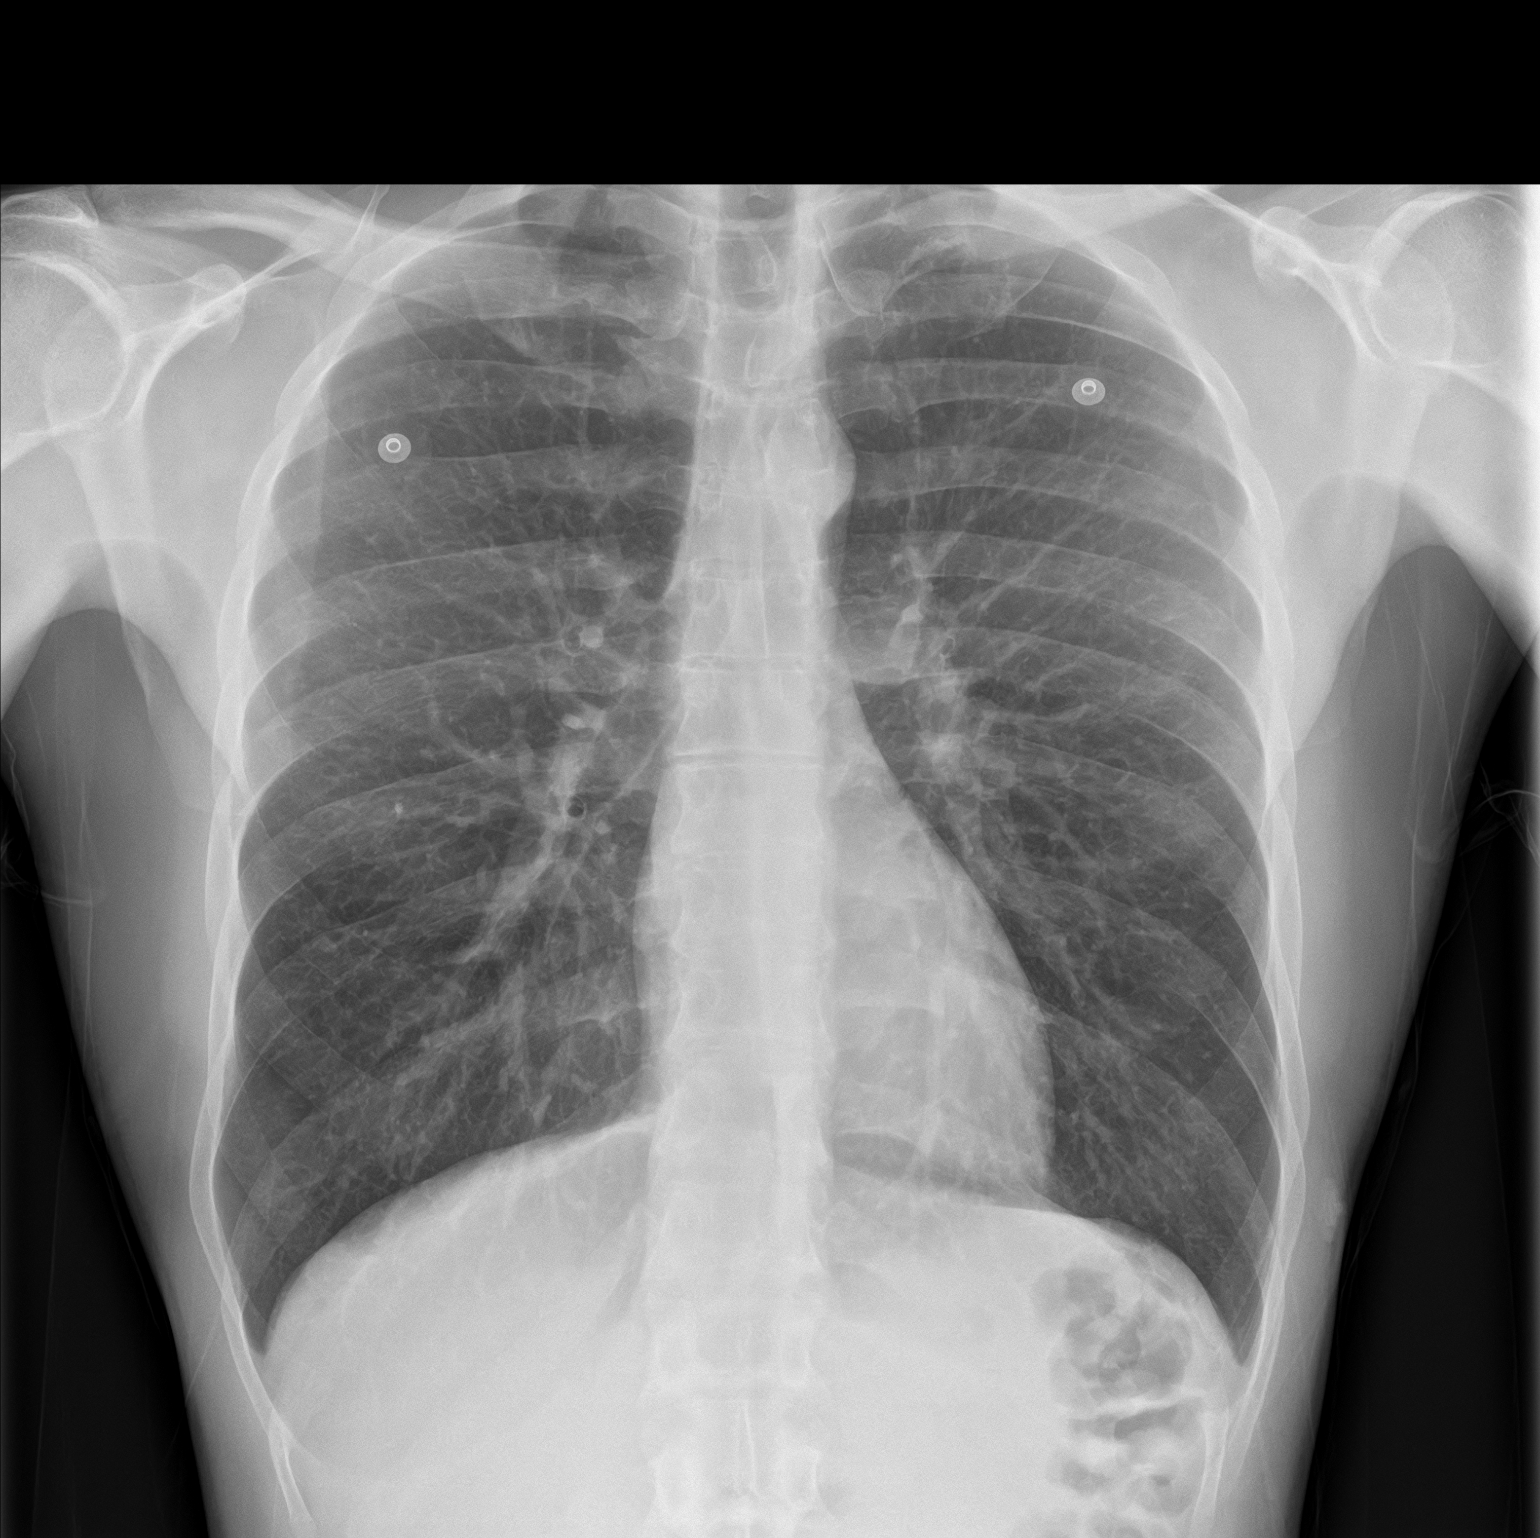
[im 2/2]
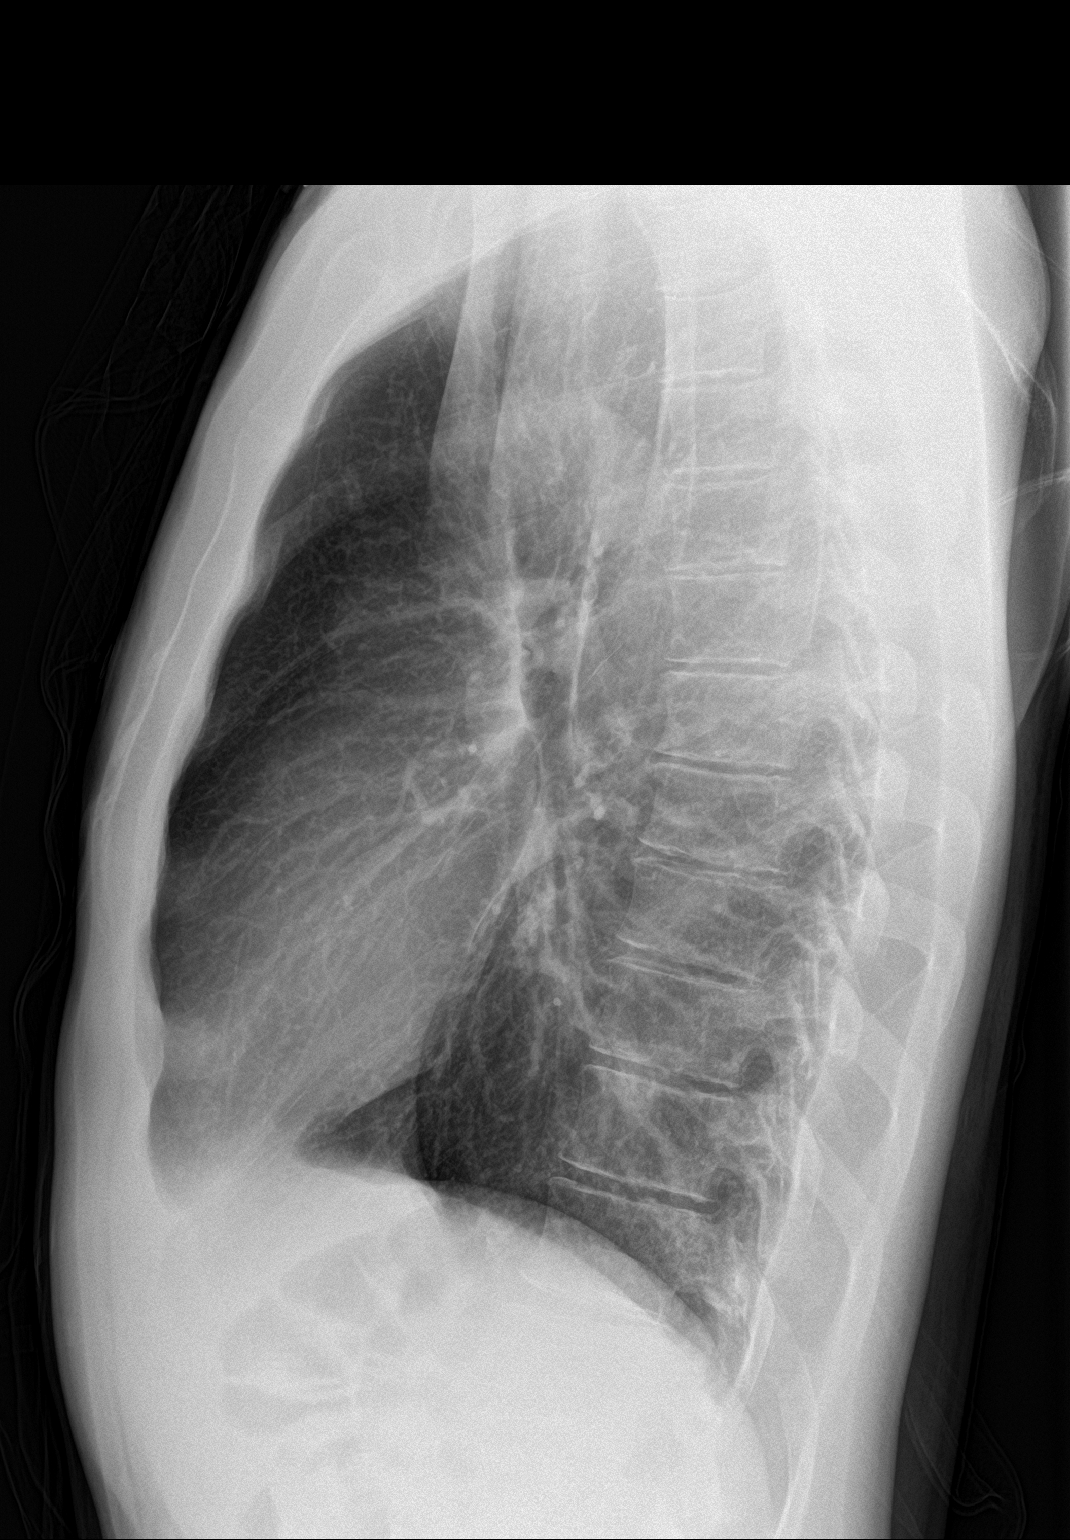

[2 of 2 positions shown; findings below may reference images not displayed]

FINDINGS: Frontal and lateral views of the chest demonstrate an unremarkable
cardiac silhouette. No acute airspace disease, effusion, or
pneumothorax. No acute bony abnormalities.
IMPRESSION: 1. No acute intrathoracic process.

## 2024-02-17 ENCOUNTER — Emergency Department
Admission: EM | Admit: 2024-02-17 | Discharge: 2024-02-17 | Disposition: A | Discharge status: Home / Self Care | Payer: Self-pay | Attending: Emergency Medicine | Admitting: Emergency Medicine

## 2024-02-17 ENCOUNTER — Other Ambulatory Visit: Payer: Self-pay

## 2024-02-17 DIAGNOSIS — M79671 Pain in right foot: Secondary | ICD-10-CM | POA: Insufficient documentation

## 2024-02-17 DIAGNOSIS — M79672 Pain in left foot: Secondary | ICD-10-CM | POA: Insufficient documentation

## 2024-02-17 NOTE — ED Triage Notes (Signed)
 Pt arrives via POV with c/o bilateral foot pain, where they have calluses that are causing pain and long toenails that they have trouble cutting. Pt has a hx of cerebral palsy and had surgeries on their feet when they were a child that have caused issues with how their toenails grow. Pt is A&Ox4 during triage.

## 2024-02-17 NOTE — Discharge Instructions (Signed)
 Please follow-up with podiatry for footcare.  Please return for any new, worsening, or changing symptoms or other concerns.  It was a pleasure caring for you today.

## 2024-02-17 NOTE — ED Provider Notes (Signed)
 [facility] Provider Note    Event Date/Time   First MD Initiated Contact with Patient 02/17/24 1201     (approximate)   History   Foot Pain   HPI  Nichole Keltner is a 38 y.o. male with medical history of cerebral palsy who presents today for evaluation of bilateral foot pain.  Patient reports that he has not been cutting his toenails for a long time and now they are curled and causing him pain.  No fevers or chills.  He still able to ambulate.  He has never seen a podiatrist.  There are no active problems to display for this patient.         Physical Exam   Triage Vital Signs: ED Triage Vitals  Encounter Vitals Group     BP 02/17/24 1131 138/64     Girls Systolic BP Percentile --      Girls Diastolic BP Percentile --      Boys Systolic BP Percentile --      Boys Diastolic BP Percentile --      Pulse Rate 02/17/24 1131 73     Resp 02/17/24 1131 18     Temp 02/17/24 1131 98.3 F (36.8 C)     Temp Source 02/17/24 1131 Oral     SpO2 02/17/24 1131 100 %     Weight 02/17/24 1131 145 lb (65.8 kg)     Height 02/17/24 1131 5' 8 (1.727 m)     Head Circumference --      Peak Flow --      Pain Score 02/17/24 1141 6     Pain Loc --      Pain Education --      Exclude from Growth Chart --     Most recent vital signs: Vitals:   02/17/24 1131  BP: 138/64  Pulse: 73  Resp: 18  Temp: 98.3 F (36.8 C)  SpO2: 100%    Physical Exam Vitals and nursing note reviewed.  Constitutional:      General: Awake and alert. No acute distress.    Appearance: Normal appearance. The patient is normal weight.  HENT:     Head: Normocephalic and atraumatic.     Mouth: Mucous membranes are moist.  Eyes:     General: PERRL. Normal EOMs        Right eye: No discharge.        Left eye: No discharge.     Conjunctiva/sclera: Conjunctivae normal.  Cardiovascular:     Rate and Rhythm: Normal rate and regular rhythm.     Pulses: Normal pulses.  Pulmonary:      Effort: Pulmonary effort is normal. No respiratory distress.     Breath sounds: Normal breath sounds.  Abdominal:     Abdomen is soft. There is no abdominal tenderness. No rebound or guarding. No distention. Musculoskeletal:        General: No swelling. Normal range of motion.     Cervical back: Normal range of motion and neck supple.  Lateral feet with thickened, and several inches long toenails that are crawled over.  Sides of feet with corn noted.  No erythema, swelling, fluctuance, or drainage. Skin:    General: Skin is warm and dry.     Capillary Refill: Capillary refill takes less than 2 seconds.     Findings: No rash.  Neurological:     Mental Status: The patient is awake and alert.      ED Results / Procedures /  Treatments   Labs (all labs ordered are listed, but only abnormal results are displayed) Labs Reviewed - No data to display   EKG     RADIOLOGY     PROCEDURES:  Critical Care performed:   Procedures   MEDICATIONS ORDERED IN ED: Medications - No data to display   IMPRESSION / MDM / ASSESSMENT AND PLAN / ED COURSE  I reviewed the triage vital signs and the nursing notes.   Differential diagnosis includes, but is not limited to, callus, corn, toenail problem.  Patient is awake and alert, hemodynamically stable and afebrile.  He has long and curled toenails that are thickened and will require specialty care by podiatry as we do not have the proper tools for nail care in the ER.  There is no erythema, swelling, drainage, or other signs of infection at this time.  He is able to ambulate and reports that it is only mildly uncomfortable when he wears his work shoes.  He is in agreement with plan to follow-up with podiatry and the appropriate follow-up information was provided.  We discussed return precautions in the meantime.  Patient understands and agrees with plan.  He was discharged in stable condition.   Patient's presentation is most consistent with  acute complicated illness / injury requiring diagnostic workup.     FINAL CLINICAL IMPRESSION(S) / ED DIAGNOSES   Final diagnoses:  Foot pain, bilateral     Rx / DC Orders   ED Discharge Orders     None        Note:  This document was prepared using Dragon voice recognition software and may include unintentional dictation errors.   Pinkey Mcjunkin E, PA-C 02/17/24 1407    Waymond Lorelle Cummins, MD 02/17/24 [phone]
